# Patient Record
Sex: Male | Born: 1935 | Race: White | Hispanic: No
Health system: Southern US, Community
[De-identification: ages and names within clinical notes are randomized; demographics above are authoritative.]

---

## 2013-03-07 ENCOUNTER — Ambulatory Visit: Payer: Self-pay | Admitting: Internal Medicine

## 2013-03-21 ENCOUNTER — Emergency Department: Payer: Self-pay | Admitting: Emergency Medicine

## 2013-03-23 ENCOUNTER — Inpatient Hospital Stay: Payer: Self-pay | Admitting: Specialist

## 2013-03-23 ENCOUNTER — Emergency Department: Payer: Self-pay | Admitting: Emergency Medicine

## 2013-03-23 LAB — CBC
HCT: 24.6 % — ABNORMAL LOW (ref 40.0–52.0)
MCH: 27.7 pg (ref 26.0–34.0)
MCHC: 34.2 g/dL (ref 32.0–36.0)
MCV: 81 fL (ref 80–100)
Platelet: 138 10*3/uL — ABNORMAL LOW (ref 150–440)
RBC: 3.03 10*6/uL — ABNORMAL LOW (ref 4.40–5.90)

## 2013-03-23 LAB — COMPREHENSIVE METABOLIC PANEL
Albumin: 3.1 g/dL — ABNORMAL LOW (ref 3.4–5.0)
Alkaline Phosphatase: 398 U/L — ABNORMAL HIGH (ref 50–136)
Anion Gap: 10 (ref 7–16)
BUN: 50 mg/dL — ABNORMAL HIGH (ref 7–18)
Calcium, Total: 8.4 mg/dL — ABNORMAL LOW (ref 8.5–10.1)
Co2: 22 mmol/L (ref 21–32)
Creatinine: 2.95 mg/dL — ABNORMAL HIGH (ref 0.60–1.30)
EGFR (African American): 21 — ABNORMAL LOW
EGFR (Non-African Amer.): 18 — ABNORMAL LOW
Glucose: 108 mg/dL — ABNORMAL HIGH (ref 65–99)
Potassium: 5.1 mmol/L (ref 3.5–5.1)
SGOT(AST): 108 U/L — ABNORMAL HIGH (ref 15–37)
SGPT (ALT): 20 U/L (ref 12–78)
Total Protein: 6 g/dL — ABNORMAL LOW (ref 6.4–8.2)

## 2013-03-23 LAB — TROPONIN I: Troponin-I: 0.05 ng/mL

## 2013-03-23 LAB — URINALYSIS, COMPLETE
Bilirubin,UR: NEGATIVE
Glucose,UR: NEGATIVE mg/dL (ref 0–75)
Hyaline Cast: 5
Ketone: NEGATIVE
Leukocyte Esterase: NEGATIVE
Protein: 30
RBC,UR: 1 /HPF (ref 0–5)
Specific Gravity: 1.021 (ref 1.003–1.030)
Squamous Epithelial: 1
WBC UR: 2 /HPF (ref 0–5)

## 2013-03-23 LAB — IRON AND TIBC
Iron Saturation: 24 %
Unbound Iron-Bind.Cap.: 190 ug/dL

## 2013-03-23 LAB — CK TOTAL AND CKMB (NOT AT ARMC)
CK, Total: 294 U/L — ABNORMAL HIGH (ref 35–232)
CK-MB: 2 ng/mL (ref 0.5–3.6)

## 2013-03-23 LAB — FOLATE: Folic Acid: 15.1 ng/mL (ref 3.1–100.0)

## 2013-03-24 LAB — COMPREHENSIVE METABOLIC PANEL
Alkaline Phosphatase: 352 U/L — ABNORMAL HIGH (ref 50–136)
Anion Gap: 9 (ref 7–16)
BUN: 46 mg/dL — ABNORMAL HIGH (ref 7–18)
Bilirubin,Total: 0.7 mg/dL (ref 0.2–1.0)
Calcium, Total: 8.5 mg/dL (ref 8.5–10.1)
Chloride: 108 mmol/L — ABNORMAL HIGH (ref 98–107)
Creatinine: 2.11 mg/dL — ABNORMAL HIGH (ref 0.60–1.30)
Potassium: 4.4 mmol/L (ref 3.5–5.1)
SGPT (ALT): 18 U/L (ref 12–78)
Sodium: 136 mmol/L (ref 136–145)

## 2013-03-24 LAB — CBC WITH DIFFERENTIAL/PLATELET
Basophil #: 0.1 10*3/uL (ref 0.0–0.1)
Basophil %: 0.8 %
HCT: 20.1 % — ABNORMAL LOW (ref 40.0–52.0)
HGB: 6.8 g/dL — ABNORMAL LOW (ref 13.0–18.0)
Lymphocyte #: 0.4 10*3/uL — ABNORMAL LOW (ref 1.0–3.6)
Lymphocyte %: 6.7 %
MCH: 27.5 pg (ref 26.0–34.0)
Monocyte #: 0.5 x10 3/mm (ref 0.2–1.0)
Neutrophil %: 83.7 %
RDW: 16.7 % — ABNORMAL HIGH (ref 11.5–14.5)

## 2013-03-24 LAB — PSA: PSA: 3422 ng/mL — ABNORMAL HIGH (ref 0.0–4.0)

## 2013-04-06 ENCOUNTER — Ambulatory Visit: Payer: Self-pay | Admitting: Internal Medicine

## 2013-05-07 DEATH — deceased

## 2013-10-11 IMAGING — CT CT CERVICAL SPINE WITHOUT CONTRAST
1 series · 12 of 14 positions shown, 15 images · non-contrast
Comparison: none

REASON FOR EXAM: neck pain
COMMENTS:

PROCEDURE:     CT  - CT CERVICAL SPINE WO  - March 23, 2013 [DATE]
RESULT:     Cervical spine CT dated 03/23/2013.
TECHNIQUE: Multiplanar imaging of the cervical spine was obtained utilizing
helical 2 mm acquisition and bone reconstruction algorithm.

[Series 6: axial · axial · 0.33mm/px · z∈[-215,-69]mm · 12 of 91 slices shown, 15 images]
[im 7/91  soft-tissue]
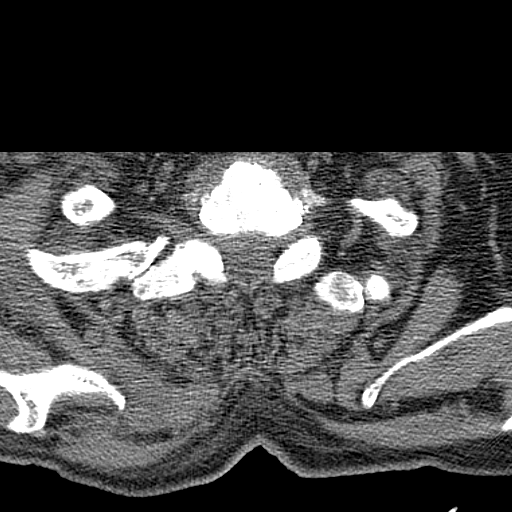
[im 7/91  bone]
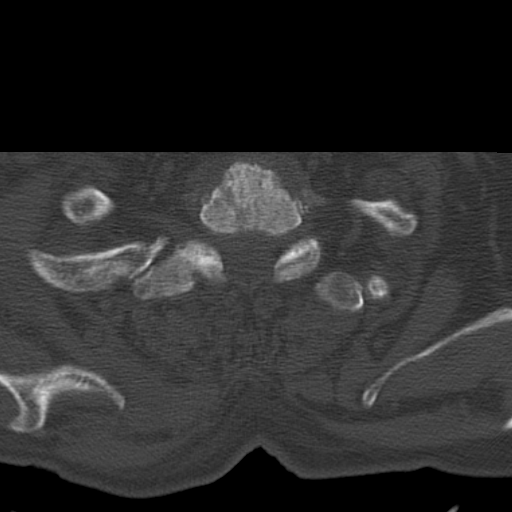
[im 14/91  bone]
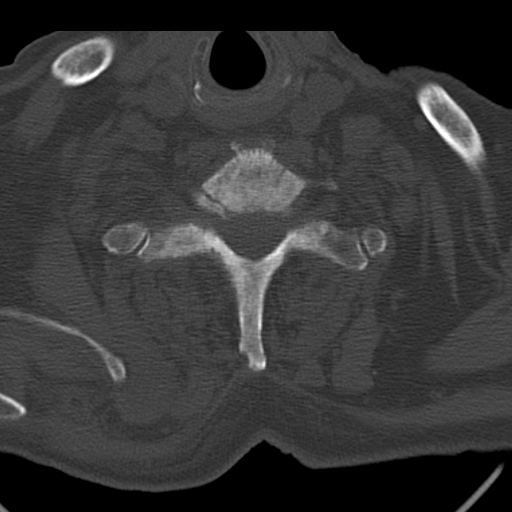
[im 21/91  bone]
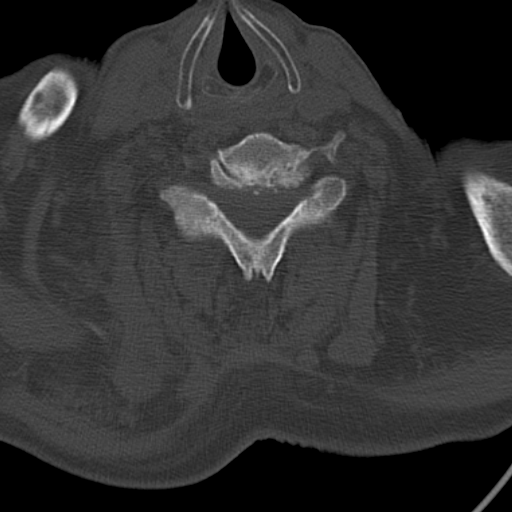
[im 28/91  bone]
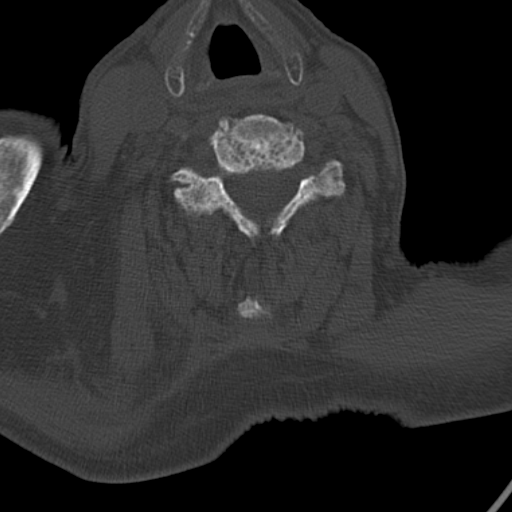
[im 35/91  soft-tissue]
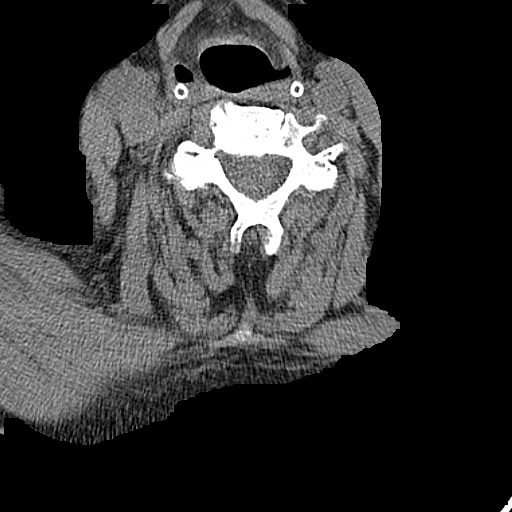
[im 35/91  bone]
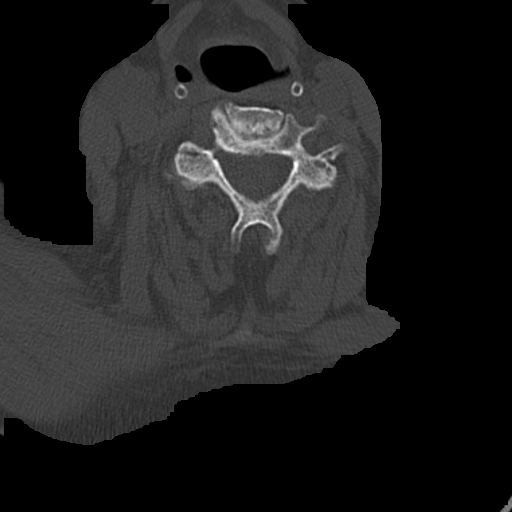
[im 42/91  bone]
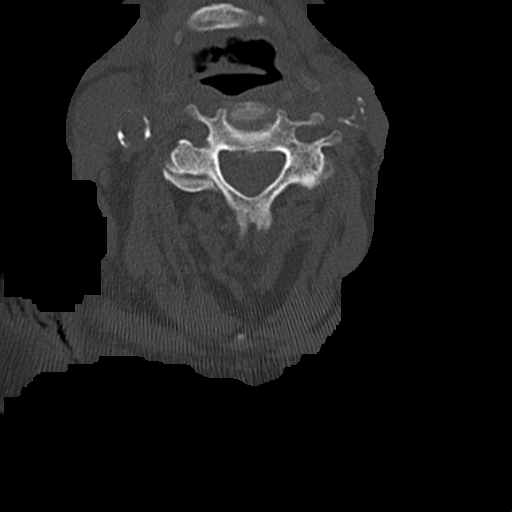
[im 49/91  bone]
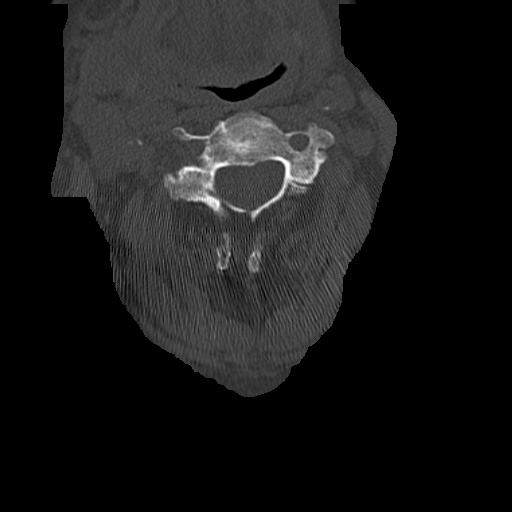
[im 56/91  bone]
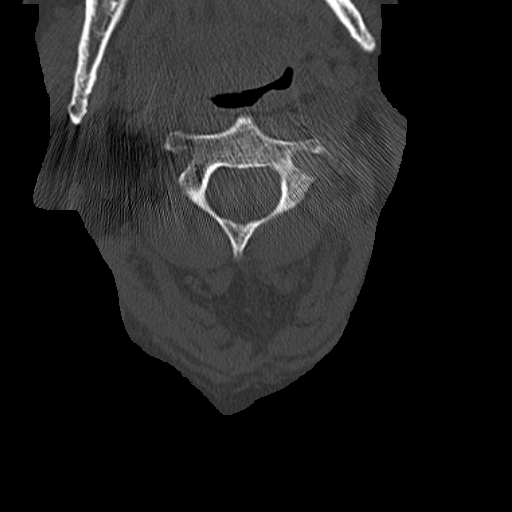
[im 63/91  soft-tissue]
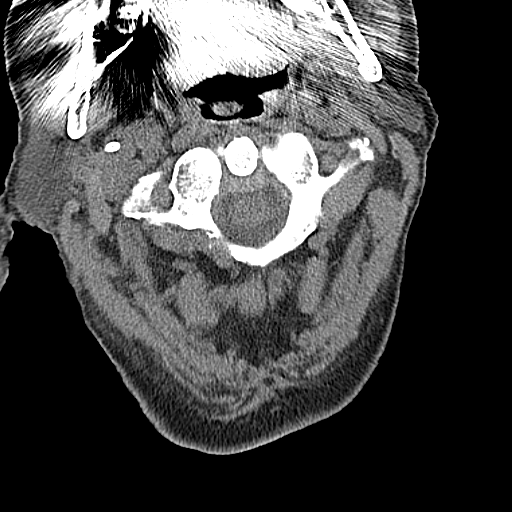
[im 63/91  bone]
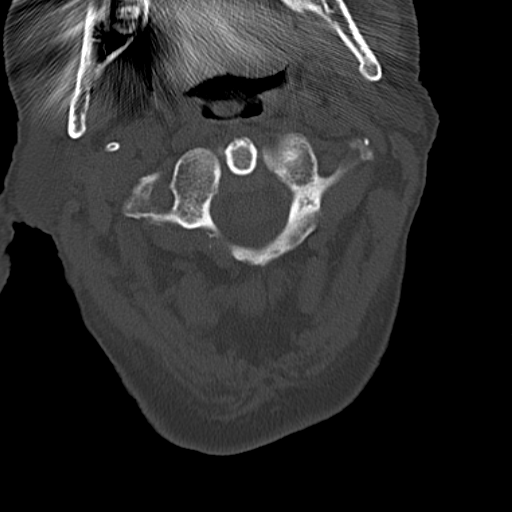
[im 70/91  bone]
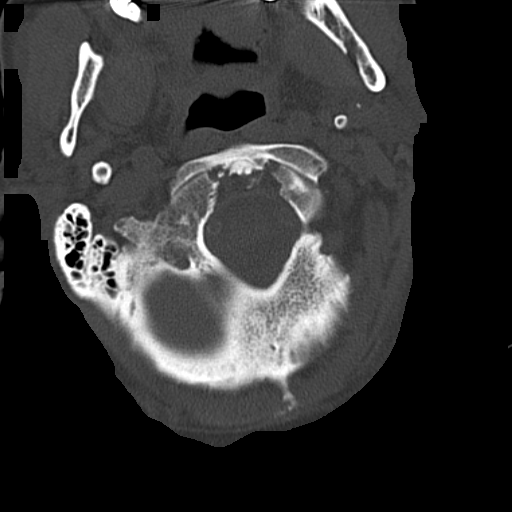
[im 77/91  bone]
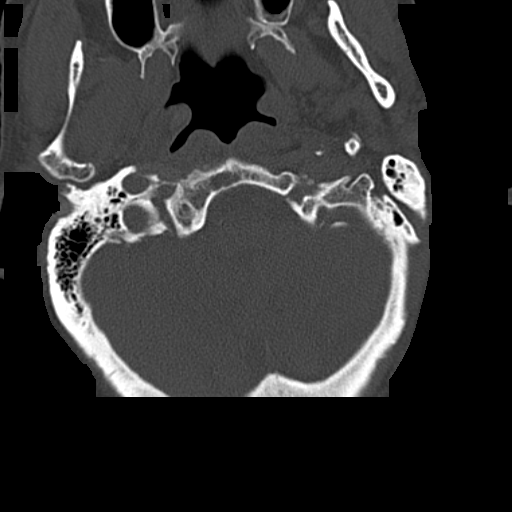
[im 84/91  bone]
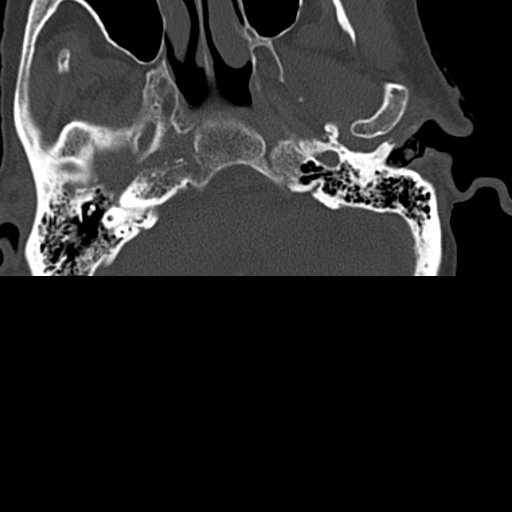

[12 of 14 positions shown; findings below may reference images not displayed]

FINDINGS: There is no evidence of fracture, dislocation, nor canal stent
stenosis. There is no evidence of prevertebral soft tissue swelling.
Multilevel to space narrowing is appreciated as well as endplate subchondral
sclerosis, subchondral cyst formation, and endplate hypertrophic spurring.
Multilevel facet sclerosis is also identified.
IMPRESSION: Multilevel spondylolysis without evidence of acute osseous
abnormalities.

## 2013-10-11 IMAGING — US ABDOMEN ULTRASOUND
1 series · 13 of 25 positions shown · non-contrast
Comparison: none

REASON FOR EXAM: elev lft elev cr
COMMENTS:

[Series 1: abdomen ultrasound · 0.31mm/px · 13 of 66 slices shown]
[im 1/66]
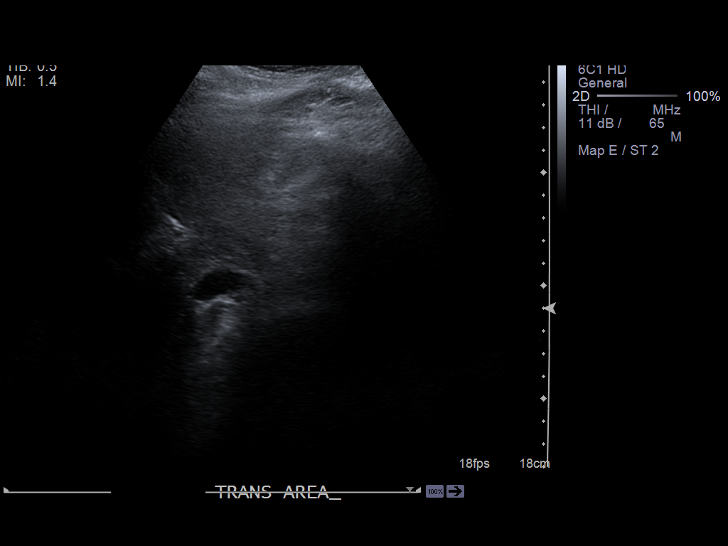
[im 6/66]
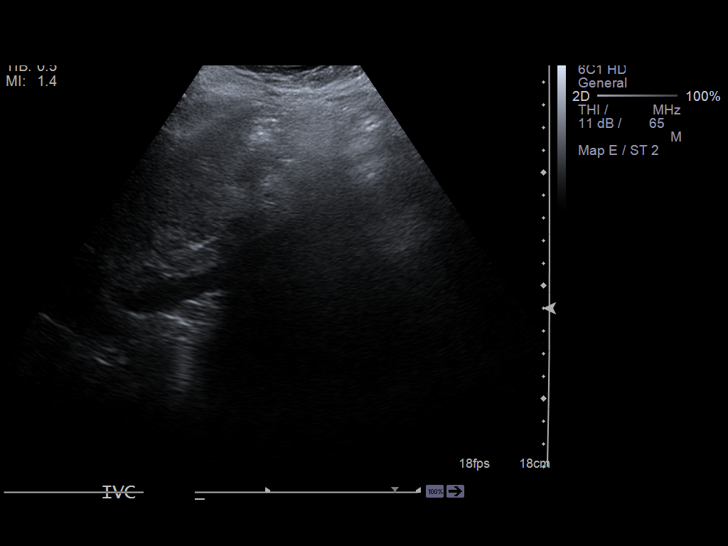
[im 11/66]
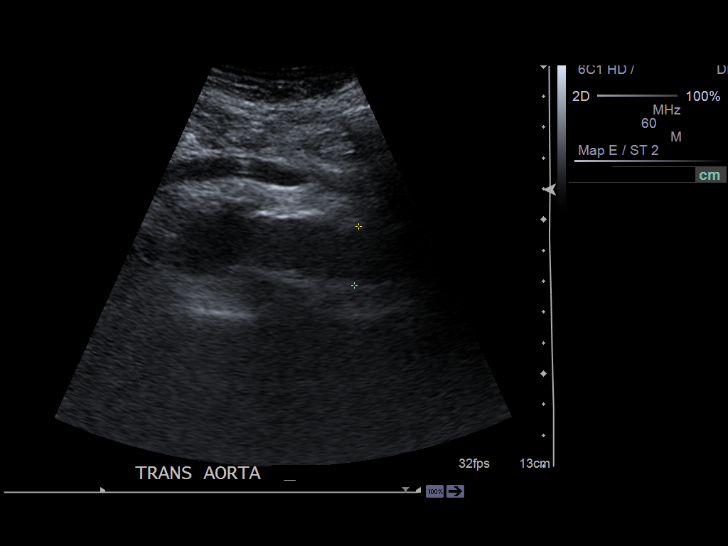
[im 17/66]
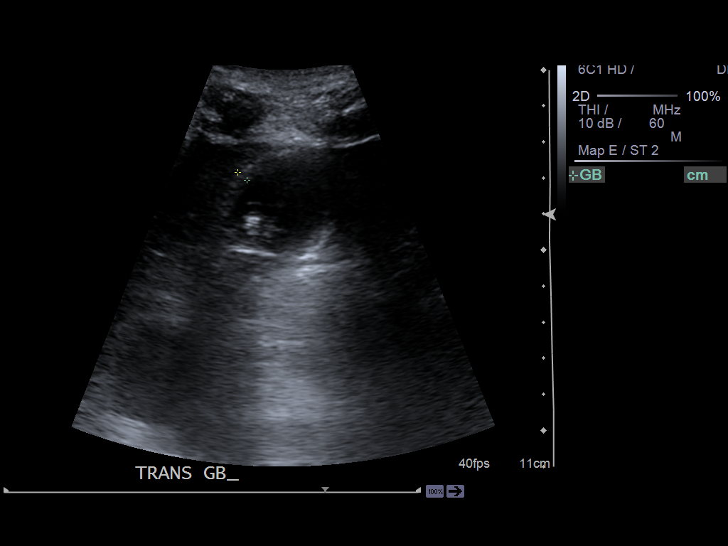
[im 22/66]
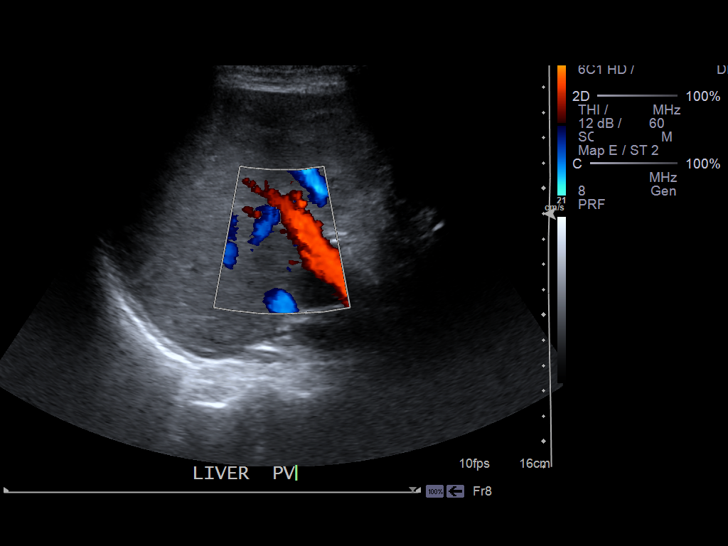
[im 28/66]
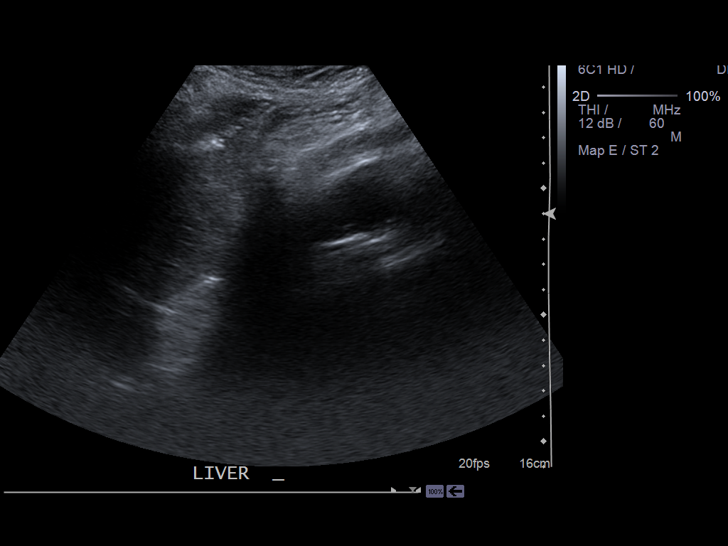
[im 33/66]
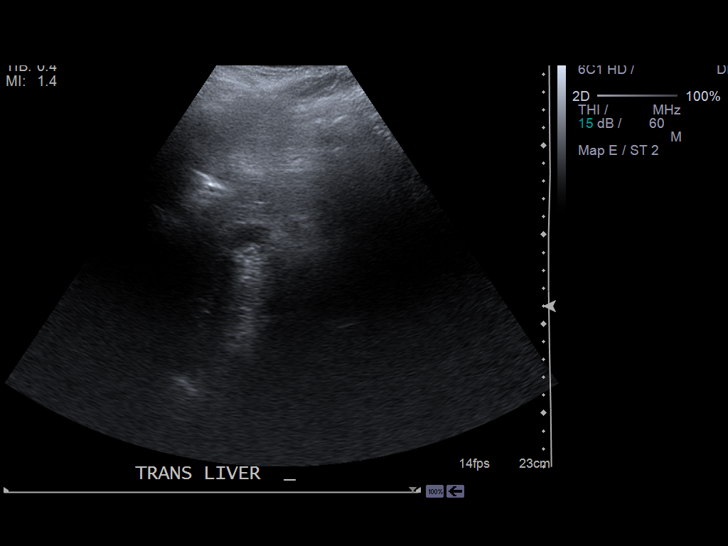
[im 38/66]
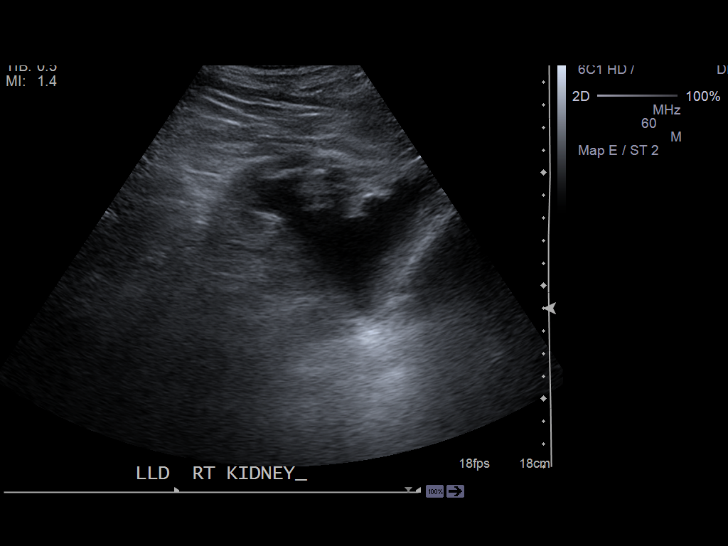
[im 44/66]
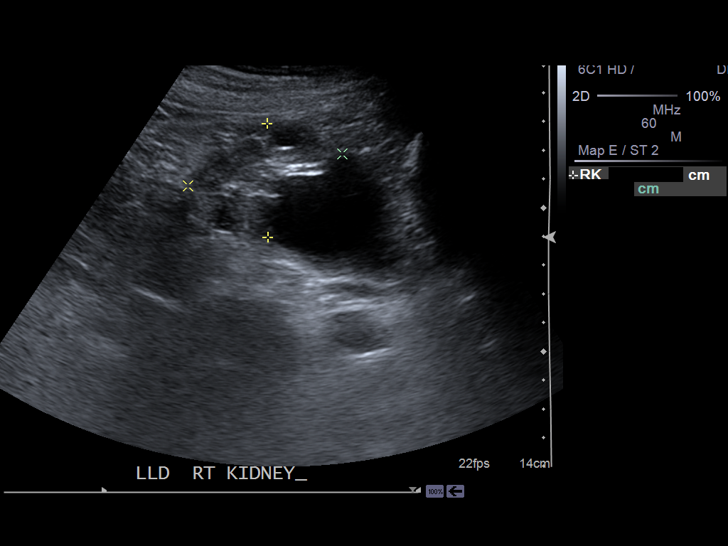
[im 49/66]
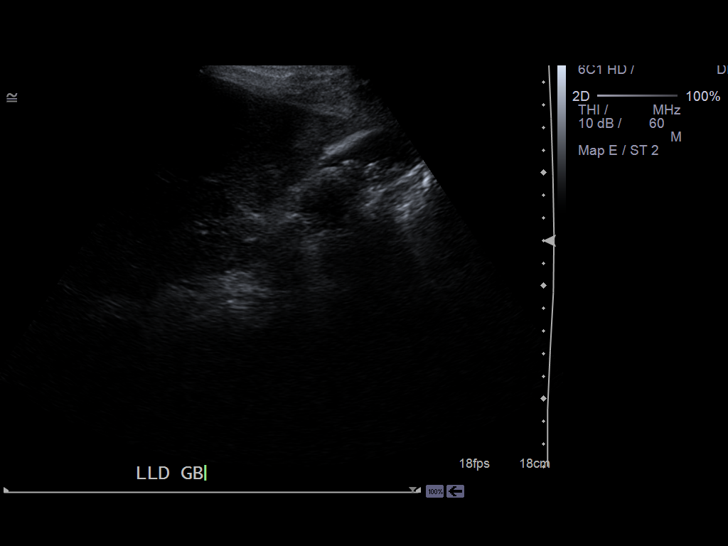
[im 55/66]
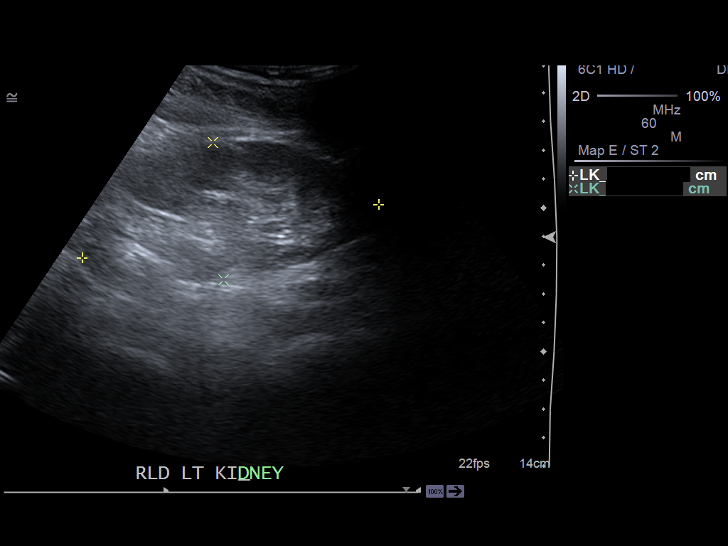
[im 60/66]
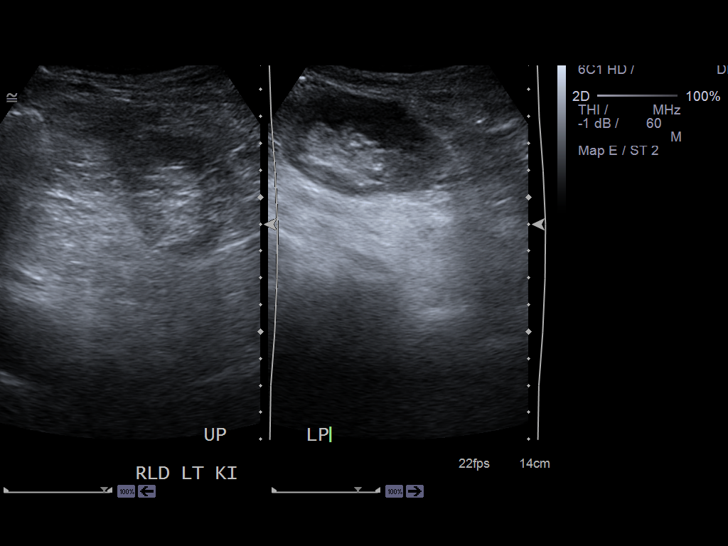
[im 66/66]
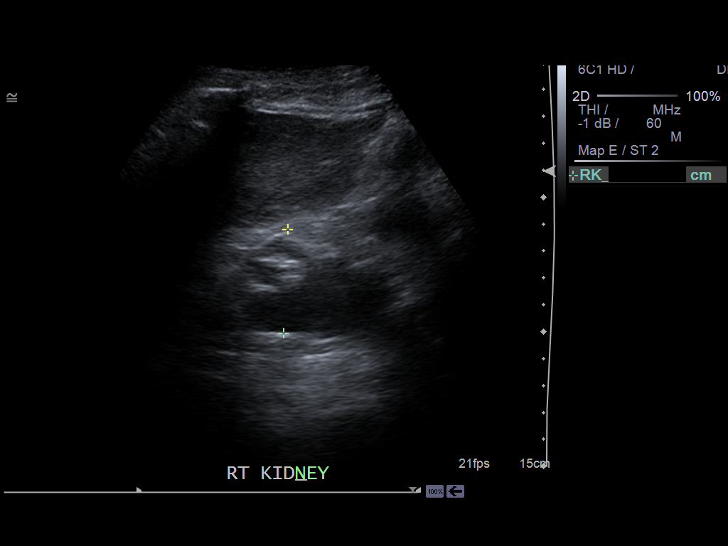

[13 of 25 positions shown; findings below may reference images not displayed]

PROCEDURE:     US  - US ABDOMEN GENERAL SURVEY  - March 23, 2013 [DATE]

RESULT:     Abdominal sonogram is performed. The pancreas is not well seen
because of overlying bowel gas. The abdominal aorta shows evidence of mild
aneurysmal dilation up to 3.03 cm anterior to posterior in the midportion
but appears to taper distally. The distal anterior to posterior dimension is
1.9 cm. Followup is recommended. There is cholelithiasis demonstrated with
multiple echogenic shadowing stones present in the gallbladder. The
gallbladder wall thickness is abnormal at 3.4 mm to 3.8 mm. Correlate for
acute cholecystitis. There is reported a negative sonographic Murphy's sign.
Common bile duct diameter is 4.3 mm. The liver shows no intrahepatic biliary
ductal dilation or focal mass. Portal venous flow is normal. There is right
hydronephrosis that is moderate to severe. The right kidney measures 9.0 x
3.96 x 3.6 cm. Urologic consultation is recommended. The spleen is 9.74 cm
in length and appears normal in echotexture. The inferior vena cava cannot
be seen. The left kidney measures 10.48 x 5.50 x 4.79 cm.
IMPRESSION: 1. Cholelithiasis with thickening of the gallbladder wall to 3.8 mm which
can be consistent with acute cholecystitis. The technologist reports a
negative sonographic Murphy's sign. If there is clinical concern for acute
cholecystitis surgical consultation would be recommended. A hepatobiliary
scan is available for further assessment if desired.
2. Moderate to severe right hydronephrosis of uncertain etiology.

[REDACTED]

## 2015-03-29 NOTE — Consult Note (Signed)
PATIENT NAME:  Nicholas Lamb, Nicholas Lamb MR#:  [id number] DATE OF BIRTH:  21-Feb-1920  DATE OF CONSULTATION:  03/24/2013  REFERRING PHYSICIAN:  Rolly Pancake. Cherlynn Kaiser, MD CONSULTING PHYSICIAN:  Siddhi Dornbush Lizabeth Leyden, MD  REASON FOR CONSULTATION: Acute renal failure.   HISTORY OF PRESENT ILLNESS: The patient is a 81 year old Caucasian male with past medical history of dementia, hypothyroidism, hypertension, coronary artery disease status post CABG, history of prostate cancer, who presented yesterday to [facility] for evaluation of frequent falls. The patient is unable to provide significant history regarding the present illness. However, his daughter, who is his healthcare power of attorney, is at the bedside. She relates that the patient has experienced 4 falls over the past week. We are consulted for the evaluation and management of acute renal failure at this point in time. The patient's presenting creatinine was 2.95. Creatinine now is 2.11. The patient's daughter relates to me that he has had quite poor p.o. intake while at the assisted living facility over the past week. He was started on IV fluid hydration here at [facility] and it appears that there has been some improvement in his renal function. The patient's daughter requested very conservative evaluation and treatment given the underlying dementia. The patient had an abdominal ultrasound performed, which showed the right kidney measuring 9 cm. The left kidney was measured as being 10.48 cm. There was also moderate to severe right-sided hydronephrosis and the patient has a history of prostate cancer.   PAST MEDICAL HISTORY:  1.  Alzheimer dementia.  2.  Hypothyroidism.  3.  Hypertension.  4.  Coronary artery disease, status post CABG.  5.  History of prostate cancer.  6.  Back pain.   CURRENT INPATIENT MEDICATIONS: Include: 1.  Normal saline 0.9 at 100 mL/h, Tylenol 650 mg q.4 hours p.r.n. 2.  Benzonatate 100 mg  q.6 hours p.r.n. 3.  Casodex 50 mg daily. 4.  Flexeril 5 mg b.i.d. 5.  Aricept 10 mg at bedtime. 6.  Ferrous sulfate 325 mg b.i.d. 7.  Synthroid 0.025 mg daily at 6:00 a.m. 8.  Namenda 10 mg b.i.d.  9.  Zofran 4 mg IV q.4 hours p.r.n.   SOCIAL HISTORY: The patient resides at [facility]. He is widowed. He has 2 children. His daughter is his healthcare power of attorney. No reported tobacco, alcohol or illicit drug use. He used to be an Psychologist, educational for [employer].   FAMILY HISTORY: The patient's mother died in old age. The patient's father died secondary to myocardial infarction.   REVIEW OF SYSTEMS: Currently unobtainable from the patient given his advanced dementia.   PHYSICAL EXAMINATION:  VITAL SIGNS: Temperature 97.8, pulse 80, respirations 20, blood pressure 101/64, pulse oximetry 99%.  GENERAL: Nicholas Lamb Caucasian male, currently in no acute distress.  HEENT: Normocephalic, atraumatic. Extraocular movements are intact. Pupils equal, round and reactive to light. No scleral icterus. Conjunctivae are pink. No epistaxis noted. Gross hearing intact. Oral mucosae are very dry.  NECK: Supple without JVD or lymphadenopathy.  LUNGS: Clear to auscultation bilaterally with normal respiratory effort.  HEART: S1, S2, regular rate and rhythm. No murmurs or rubs appreciated.  ABDOMEN: Soft, nontender, nondistended. Bowel sounds positive. No rebound or guarding. No gross organomegaly appreciated.  EXTREMITIES: No clubbing, cyanosis or edema.  NEUROLOGIC: The patient is awake and alert. He is only following simple commands inconsistently. Currently disoriented.  MUSCULOSKELETAL: No joint redness, swelling or tenderness appreciated.  SKIN: Warm and dry. Scattered ecchymoses noted on all 4  extremities.  PSYCHIATRIC: Unable to assess given his advanced dementia at present.   LABORATORY AND RADIOLOGICAL DATA: CBC shows WBC 6.5, hemoglobin 6.8, hematocrit 20, platelets 126. CMP shows sodium 136,  potassium 4.4, chloride 108, CO2 of 19, BUN 46, creatinine 2.1, glucose 83. Iron saturation 24%, ferritin 733. Folic acid 15.1. Abdominal ultrasound shows right kidney measuring 9 cm with moderate to severe hydronephrosis. The left kidney measured 10.48 cm.   IMPRESSION: This is a 81 year old Caucasian male with past medical history of Alzheimer dementia, hypothyroidism, hypertension, coronary disease status post coronary artery bypass graft and prostate cancer, who presented to [facility] with frequent falls and found to have poor oral intake while at an assisted living facility.   PROBLEM LIST:  1.  Acute renal failure.  2.  Metabolic acidosis with serum bicarbonate of 19.  3.  Right-sided hydronephrosis.  4.  Worsening anemia, not otherwise specified.  5.  Advanced Alzheimer dementia.   PLAN: The patient initially presented to [facility] with frequent falls. Some of his metabolic derangements could be secondary to poor p.o. intake. The patient's daughter stated that his p.o. intake over the past week has been diminished below his normal baseline. The patient was provided IV fluid hydration with 0.9 normal saline with some partial improvement in renal function, as creatinine is down to 2.1. However, the patient also has underlying right-sided hydronephrosis and has history of prostate cancer. These 2 entities may be related. I had a long discussion with the patient's daughter. She does not wish for any invasive procedures at this point in time, which is quite reasonable. It appears that the family is considering and favoring hospice services going forward. This is certainly a reasonable course of action. For now, would continue IV fluid hydration until the patient's discharge. Otherwise, we would not recommend any further aggressive evaluation.   I would like to thank Dr. Cherlynn KaiserSainani for this kind referral. Further plans per his outpatient course.    ____________________________ Nicholas PippinsMunsoor N. Slaton Reaser, MD [email or web address] D: 03/24/2013 15:02:49 ET T: 03/24/2013 15:25:45 ET JOB#: [id number]  cc: Nicholas PippinsMunsoor N. Donnivan Villena, MD, <Dictator> Ria CommentMUNSOOR N Alysha Doolan MD ELECTRONICALLY SIGNED 04/07/2013 21:03

## 2015-03-29 NOTE — Discharge Summary (Signed)
PATIENT NAME:  Nicholas Lamb, Nicholas Lamb MR#:  [id number] DATE OF BIRTH:  1920-07-29  DATE OF ADMISSION:  03/23/2013 DATE OF DISCHARGE:  03/24/2013   DISPOSITION: [facility] with hospice.   DISCHARGE DIAGNOSES:  1.  History of fall.  2.  Acute renal failure secondary to dehydration.  3.  Hydronephrosis.  4.  Gallstones.  5.  Prostate cancer.  6.  Dementia.   CODE STATUS: DNR.   DISCHARGE MEDICATIONS:  1.  Namenda 10 mg p.o. b.i.d.  2.  Aricept 10 mg p.o. daily. 3.  Flexeril 5 mg p.o. b.i.d. as needed.  4.  Zofran as needed for nausea and vomiting, patient to take 4 mg every 6 hours.  5.  Aspirin 81 mg daily.  6.  Bicalutamide 50 mg daily.  7.  Levothyroxine 25 mcg p.o. daily.   DIET: Low-sodium diet.   CONSULTATIONS: Palliative care consult with Dr. Ermalinda Memos and nephrology consult with Dr. Anthonette Legato.   HOSPITAL COURSE: The patient is a 81 year old male with dementia who is followed by [place], brought in because of falls and acute renal failure. Look at the history and physical for full details. According to the daughter, his kidney function was normal in February. The patient is at [facility]. He has multiple medical problems of dementia, hypothyroidism, history of coronary artery disease, status post stents. He was admitted to telemetry with the history of falls and acute renal failure, started on IV fluids. The patient has no previous labs to compare. On admission, his BUN and creatinine were 50 and 2.95. The patient had an initial CT of the head done, which did not show any acute changes. CT of the cervical spine also was done for evaluation of the fall. Cervical spine CT did not show any acute changes, but showed multilevel spondylosis. The patient is in [facility] and uses a walker, but according to daughter, he lost a lot of weight recently and unable to ambulate. The patient's daughter is a Equities trader and she indicated that she wants the patient  to go back to [facility] with hospice, so we called Josh, and he met with the patient's daughter who is a Marine scientist and because of his progressive decline in his functional status, the daughter chose for him to go back to [facility]. She does not want any further workup and also treatment plans, so we have decided to discharge him back to [facility] as per daughter's wishes and she also chose the hospice care option. The patient will have hospice services at [facility].   OTHER DIAGNOSES: The patient was found to have elevated LFTs with elevated alkaline phosphatase. Ultrasound showed cholelithiasis with thickening of gallbladder wall up to 3.8 mm. Again, the same was explained to the patient's daughter and does not want any further intervention or further consults. The patient's ultrasound of abdomen also showed moderate to severe hydronephrosis on the right side and has history of prostate cancer. All these findings were explained to the daughter, and also renal ultrasound recommended by the nephrologist, Dr. Anthonette Legato. Daughter understands that he is nearing the end of his life with his progressive decline in functional status. She does not want any other interventions in terms of testing or blood work or consults. She chose to transfer him back to [facility] with hospice services.   TIME SPENT: Discharge preparation and coordinating the care took more than 30 minutes.   ____________________________ Epifanio Lesches, MD sk:jm D: 03/24/2013 13:32:59 ET T:  03/24/2013 14:07:05 ET JOB#: [id number]  cc: Epifanio Lesches, MD, <Dictator> Epifanio Lesches MD ELECTRONICALLY SIGNED 04/05/2013 22:08

## 2015-03-29 NOTE — H&P (Signed)
PATIENT NAME:  Nicholas Lamb, Nicholas Lamb MR#:  [id number] DATE OF BIRTH:  1920-11-02  DATE OF ADMISSION:  03/23/2013  PRIMARY CARE PHYSICIAN:  Doctors making house calls.   CHIEF COMPLAINT:  Frequent falls.   HISTORY OF PRESENT ILLNESS:  This is a 81 year old male who presents to the hospital due to recurrent falls over the past 3 to 4 days. The patient was here in the ER on April 15 after a fall and also earlier this morning and yesterday and sent back to the assisted living where he is at. He then had a recurrent fall again today and was found underneath his bed and brought to the hospital. In the Emergency Room, the patient was noted to be in acute renal failure with a creatinine of 2.9, also noted to be anemic with a hemoglobin of 8.4. We do not have any baseline labs to compare with as the patient just moved from [place] about a month and a half ago. Due to recurrent falls and laboratory abnormalities, hospitalist services are contacted for treatment and evaluation.   REVIEW OF SYSTEMS:  CONSTITUTIONAL: Documented fever, positive generalized weakness and fatigue. No weight gain or weight loss.  EYES: No blurred or double vision.  ENT: No tinnitus. No postnasal drip. No redness of the oropharynx.  RESPIRATORY: No cough, no wheeze, no hemoptysis, no dyspnea.  CARDIOVASCULAR: No chest pain, no orthopnea, no palpitations or syncope.  GASTROINTESTINAL: No nausea. No vomiting, no diarrhea. No abdominal pain, no melena or hematochezia.  GENITOURINARY: No dysuria, no hematuria.  ENDOCRINE: No polyuria or nocturia. No heat or cold intolerance. HEMATOLOGIC: No anemia, no bruising, no bleeding.  INTEGUMENTARY: No rashes. No lesions.  MUSCULOSKELETAL: No arthritis. No swelling. No gout.  NEUROLOGIC: No numbness or tingling. No ataxia. No seizure-type activity.  PSYCHIATRIC: No anxiety. No insomnia. No ADD.   PAST MEDICAL HISTORY: Consistent wet dementia, hypothyroidism, hypertension, history of coronary  artery disease status post CABG, history of prostate cancer.   ALLERGIES:  No known drug allergies.   SOCIAL HISTORY:  Used to be a smoker, quit about 30 to 40+ years ago. No alcohol abuse. No illicit drug abuse. Currently lives with his daughter.   FAMILY HISTORY: Mother died from complications of old age. The patient had a father and 2 brothers who died from cardiac arrest at an early age.   CURRENT MEDICATIONS:  As follows: 1.  Aspirin 81 mg daily. 2.  Bactrim double strength 1 tab b.i.d. 3.  Tessalon Perles t.i.d. as needed for cough. 4.  Casodex 50 mg daily. 5.  Aricept 10 mg at bedtime. 6.  Endit cream to be applied to the buttocks t.i.d. 7.  Endit ointment to be applied to the buttocks every shift. 8.  Exforge 5/160 one tab daily.  9. Iron sulfate 325 mg b.i.d. 10.  Flexeril 5 mg b.i.d. 11.  Levaquin 500 mg b.i.d. for 7 days. 12.  Synthroid 25 mcg daily. 13.  Namenda 10 mg b.i.d. 14.  Zofran 4 mg q. 6 hours as needed. 15.  Potassium chloride extended release 8 mEq daily. 16.  Triple antibiotic cream to be applied to the left side of the face daily. 17.  Voltaren Gel 1% topical, apply 2 grams topically to the left mid back and 3 times daily for 10 days.   PHYSICAL EXAMINATION ON ADMISSION:  VITAL SIGNS:  Temperature 97.3, pulse 68, respirations 16, blood pressure 131/54, sats 100% on room air.  GENERAL:  He is a pleasant but lethargic-appearing male  in no apparent distress. HEAD, EYES, EARS, NOSE AND THROAT:  Atraumatic, normocephalic. Extraocular muscles are intact. Pupils are equal and reactive to light. No oropharyngeal erythema.  Oral mucosae is dry.  NECK: Supple. There is no jugular venous distention. No bruits. No lymphadenopathy or thyromegaly.   HEART:  Regular rate and rhythm. No murmurs, rubs or clicks.  LUNGS:  Clear to auscultation bilaterally. No rales or rhonchi. No wheezes.  ABDOMEN: Soft, flat, nontender, nondistended. Has good bowel sounds. No  hepatosplenomegaly appreciated.  EXTREMITIES: No evidence of any cyanosis, clubbing or peripheral edema. Has +2 pedal and radial pulses bilaterally.  NEUROLOGICAL: The patient is alert, awake and oriented x 2. Difficult to perform a neurological exam, given his dementia, but moves all extremities spontaneously.  SKIN: Moist and warm. He has significant bruising on his upper extremities bilaterally from the recurrent falls. He also has an abrasion on his right lower extremity which has been bandaged.  LYMPHATIC:  There is no cervical or axillary lymphadenopathy.   LABORATORY DATA:  Shows a serum glucose of 50, BUN 2.9, sodium 137, potassium 5.1, chloride 105, bicarb 22. The patient's albumin is 3.1, alk phos 398, AST 108, ALT 20, CK 294, troponin 0.05, white cell count 9.3, hemoglobin 8.4, hematocrit 24.6, platelet count of 138. Urinalysis is within normal limits.   The patient did have a CT of the head done without contrast, which showed no acute intra-cranial abnormality. Also CT cervical spine with contrast showed multiple spondylosis without evidence of acute osseous abnormalities. The patient had abdominal ultrasound done which shows cholelithiasis with thickening of the gallbladder wall, but no sonographic Murphy's sign. Moderate to severe right-sided hydronephrosis of uncertain etiology.   ASSESSMENT AND PLAN: This is a 81 year old male with a history of dementia, hypothyroidism, history of prostate cancer, history of coronary artery disease status post CABG, who presents to the hospital due to recurrent falls, noted to be anemic and also in acute renal failure.   PROBLEM: 1.  Frequent falls. The exact etiology of this is unclear, but likely related to patient's dementia and some deconditioning, also complicated with underlying anemia and acute renal failure. For now, I will hydrate the patient with IV fluids, also get a physical therapy consult to assess his mobility. The patient currently needs  assisted living, may need a high level of care. We will get case management involved. The patient had a CT head and cervical spine, which were negative. There is no evidence of any acute infectious etiology like pneumonia or urinary tract infection. 2.  Acute renal failure. I do not have a baseline creatinine to compare with. The patient's ultrasound of the abdomen showed some right-sided hydronephrosis but the patient is making urine.  This may be chronic due to his prostate cancer. For now, I will hydrate the patient with IV fluids, follow BUN and creatinine, urine output, hold his Exforge for now, get a nephrology consult.  3.  Anemia. Questionable if this is acute versus chronic. It is a normocytic anemia. The patient is heme-positive as per the Emergency Room physician, although the daughter does not want to pursue any aggressive workup at this time; therefore, hold off on a gastrointestinal consult. Continue supportive care with IV fluids and blood transfusion if needed. I will check iron, B12, folate and ferritin for now.  4.  Dementia.  Continue Namenda and Aricept.  5.  Hypothyroidism.  Continue Synthroid.  6.  Abnormal liver function tests. Etiology unclear, but ultrasound of the abdomen shows  no evidence of acute pathology. I will follow liver function tests, if needed.  7.  History of prostate cancer. Continue with his Casodex.  THE PATIENT IS A DO NOT INTUBATE/DO NOT RESUSCITATE.  Time spent on admission:  50 minutes    ____________________________ Belia Heman. Verdell Carmine, MD vjs:ce D: 03/23/2013 13:08:53 ET T: 03/23/2013 13:29:27 ET JOB#: [id number]  cc: Belia Heman. Verdell Carmine, MD, <Dictator> Henreitta Leber MD ELECTRONICALLY SIGNED 03/23/2013 21:42
